# Patient Record
Sex: Female | Born: 1935 | Race: White | Hispanic: No | Marital: Married
Health system: Southern US, Community
[De-identification: ages and names within clinical notes are randomized; demographics above are authoritative.]

---

## 2010-04-23 ENCOUNTER — Ambulatory Visit: Payer: Self-pay | Admitting: Otolaryngology

## 2010-06-10 ENCOUNTER — Ambulatory Visit: Payer: Self-pay | Admitting: Otolaryngology

## 2012-06-24 ENCOUNTER — Emergency Department: Payer: Self-pay | Admitting: Emergency Medicine

## 2012-06-24 LAB — COMPREHENSIVE METABOLIC PANEL
Albumin: 3.8 g/dL (ref 3.4–5.0)
Alkaline Phosphatase: 86 U/L (ref 50–136)
Bilirubin,Total: 0.3 mg/dL (ref 0.2–1.0)
Calcium, Total: 9.2 mg/dL (ref 8.5–10.1)
Co2: 27 mmol/L (ref 21–32)
EGFR (Non-African Amer.): 47 — ABNORMAL LOW
Glucose: 109 mg/dL — ABNORMAL HIGH (ref 65–99)
SGOT(AST): 30 U/L (ref 15–37)
SGPT (ALT): 23 U/L
Total Protein: 7.6 g/dL (ref 6.4–8.2)

## 2012-06-24 LAB — URINALYSIS, COMPLETE
Bilirubin,UR: NEGATIVE
Glucose,UR: NEGATIVE mg/dL (ref 0–75)
Ketone: NEGATIVE
Leukocyte Esterase: NEGATIVE
Nitrite: NEGATIVE
Ph: 6 (ref 4.5–8.0)
RBC,UR: <1 /HPF (ref 0–5)
Squamous Epithelial: <1
WBC UR: 4 /HPF (ref 0–5)

## 2012-06-24 LAB — CBC
MCH: 32 pg (ref 26.0–34.0)
MCHC: 34.3 g/dL (ref 32.0–36.0)
MCV: 94 fL (ref 80–100)
Platelet: 189 10*3/uL (ref 150–440)
RDW: 12.7 % (ref 11.5–14.5)

## 2012-06-25 LAB — TROPONIN I: Troponin-I: <0.02 ng/mL

## 2012-07-05 ENCOUNTER — Ambulatory Visit: Payer: Self-pay | Admitting: Orthopedic Surgery

## 2013-06-24 IMAGING — CR LEFT WRIST - COMPLETE 3+ VIEW
1 series · 4 of 4 positions shown · non-contrast
Comparison: none

REASON FOR EXAM: trauma
COMMENTS:

PROCEDURE:     DXR - DXR WRIST LT COMP WITH OBLIQUES  - June 24, 2012  [DATE]
RESULT:     Comparison: None

[Series 1: x wrist pa left · 0.14mm/px · 4 of 4 slices shown]
[im 1/4]
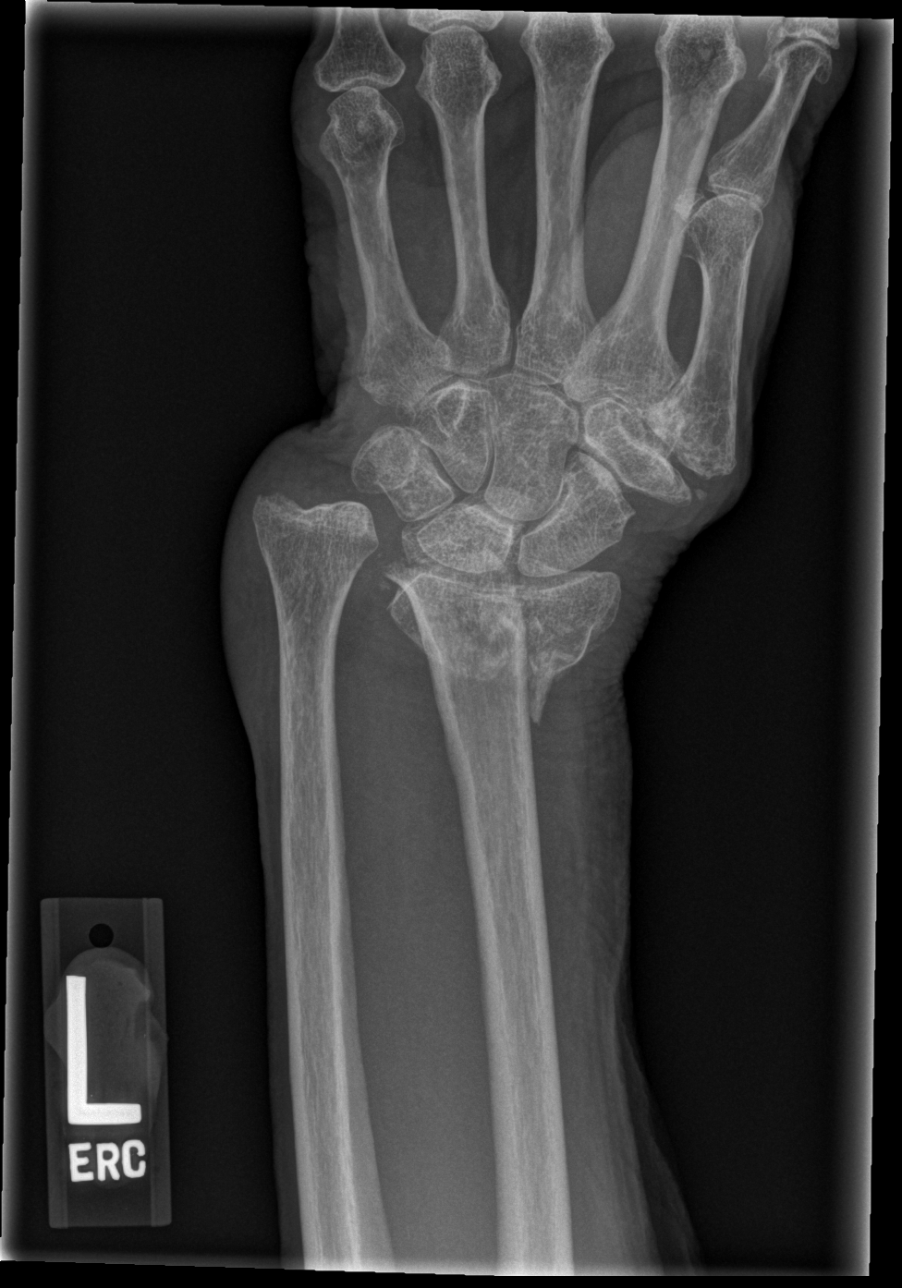
[im 2/4]
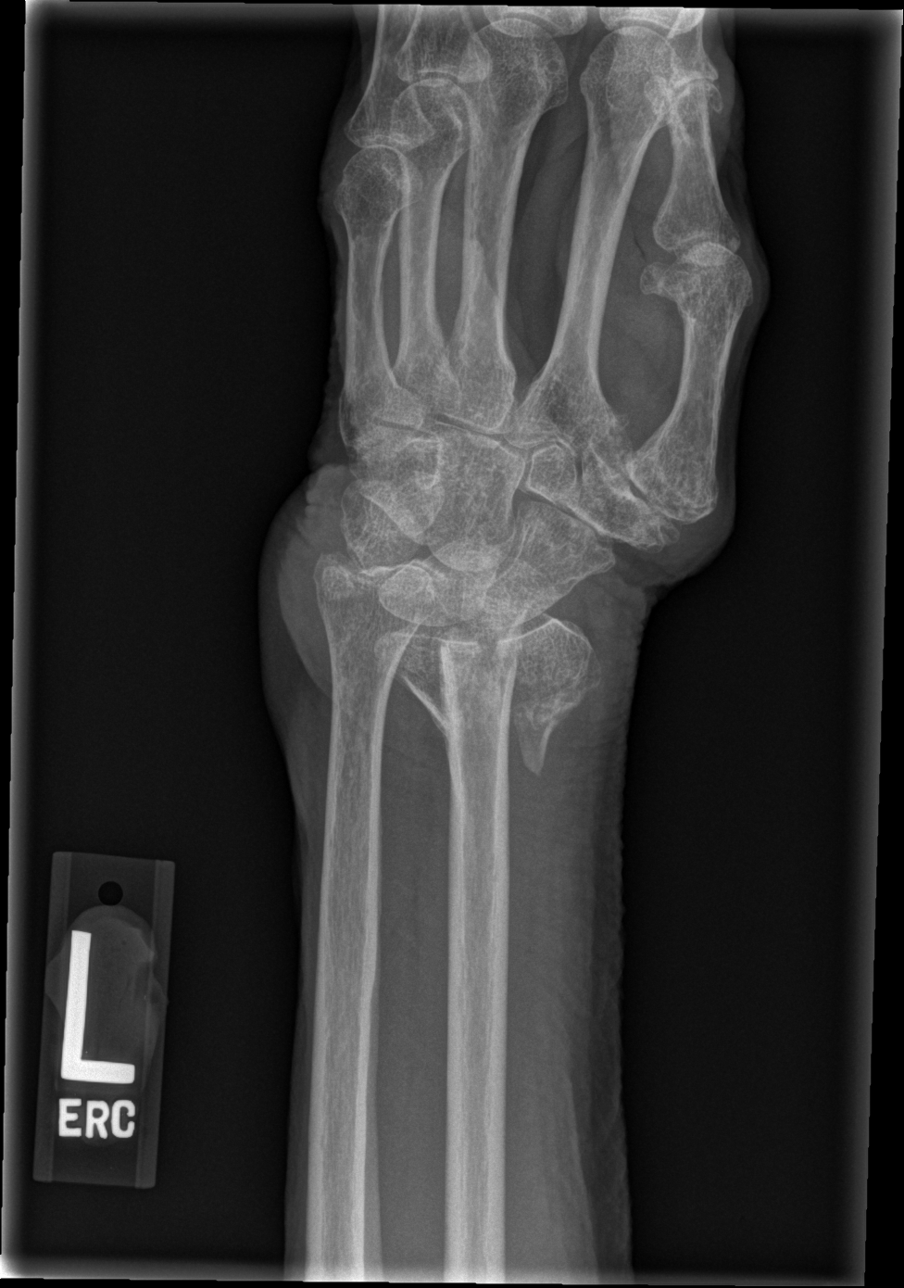
[im 3/4]
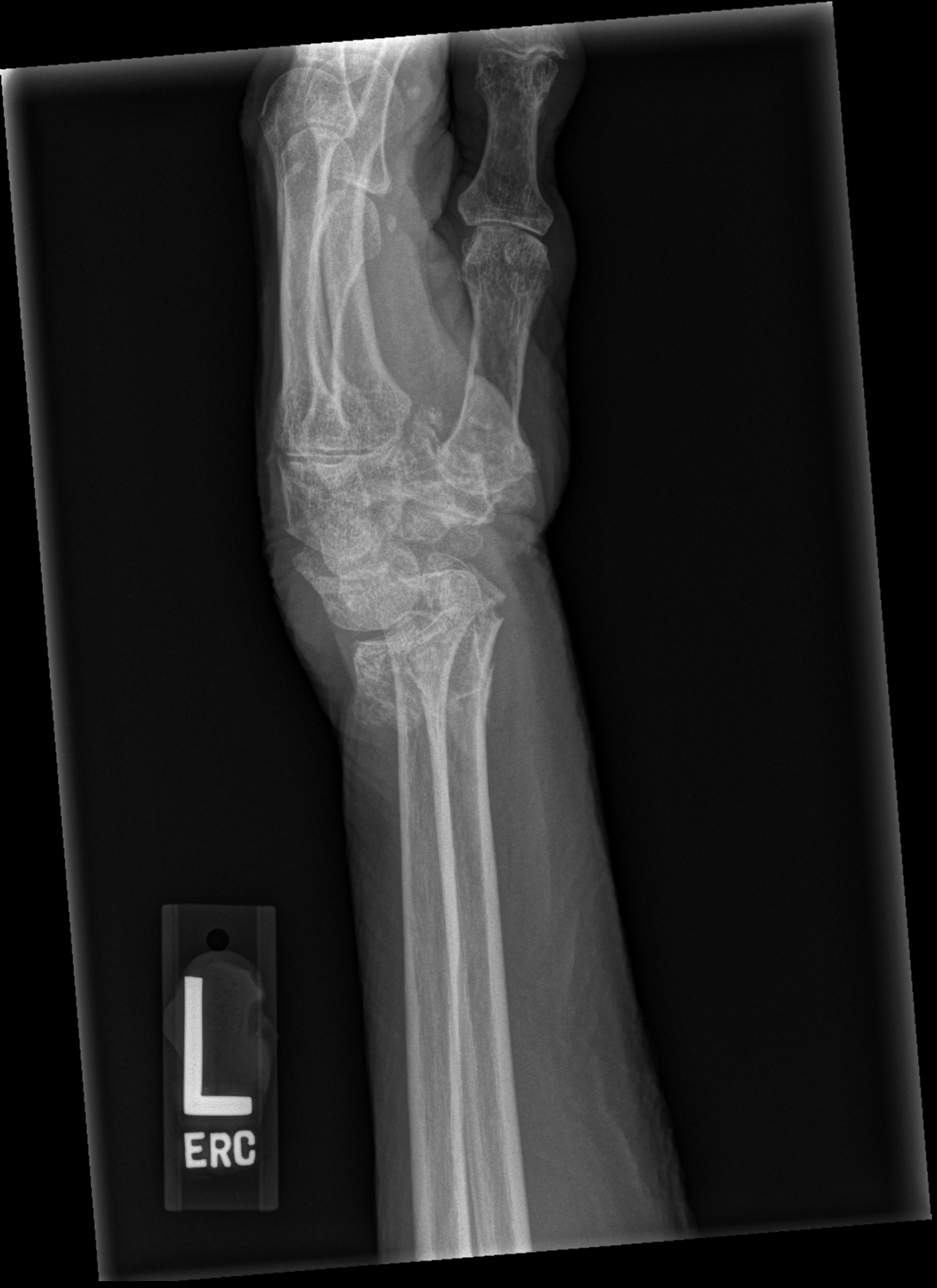
[im 4/4]
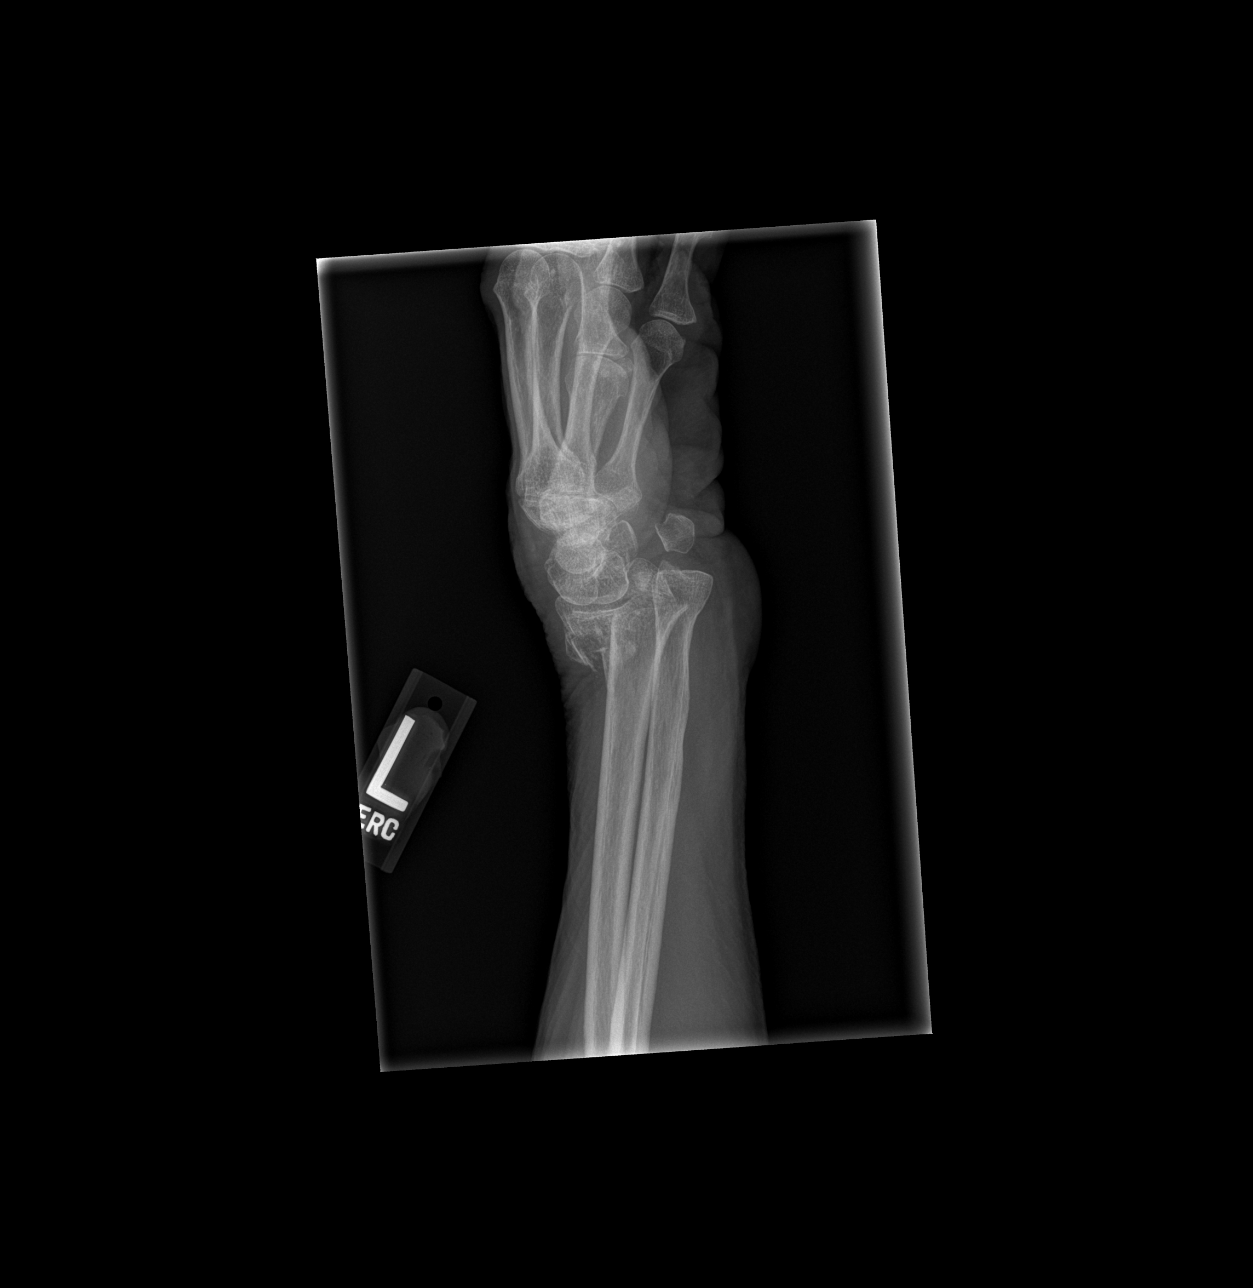

[4 of 4 positions shown; findings below may reference images not displayed]

FINDINGS: Four views of the left wrist demonstrates a comminuted an impacted distal
radial metaphyseal fracture with dorsal displacement. There is a 8 mm of
overriding between the fracture fragments.

 There is disruption of the distal radial ulnar joint. There is likely a
displaced ulnar styloid fracture. There are degenerative changes of the
first CMC joint.
IMPRESSION: Please see above.

[REDACTED]

## 2013-06-24 IMAGING — CR LEFT WRIST - COMPLETE 3+ VIEW
1 series · 4 of 4 positions shown · non-contrast
Comparison: none

REASON FOR EXAM: post reduction
COMMENTS:

PROCEDURE:     DXR - DXR WRIST LT COMP WITH OBLIQUES  - June 24, 2012 [DATE]
RESULT:     Comparison: June 24, 2012

[Series 6: x wrist pa left · 0.14mm/px · 4 of 4 slices shown]
[im 1/4]
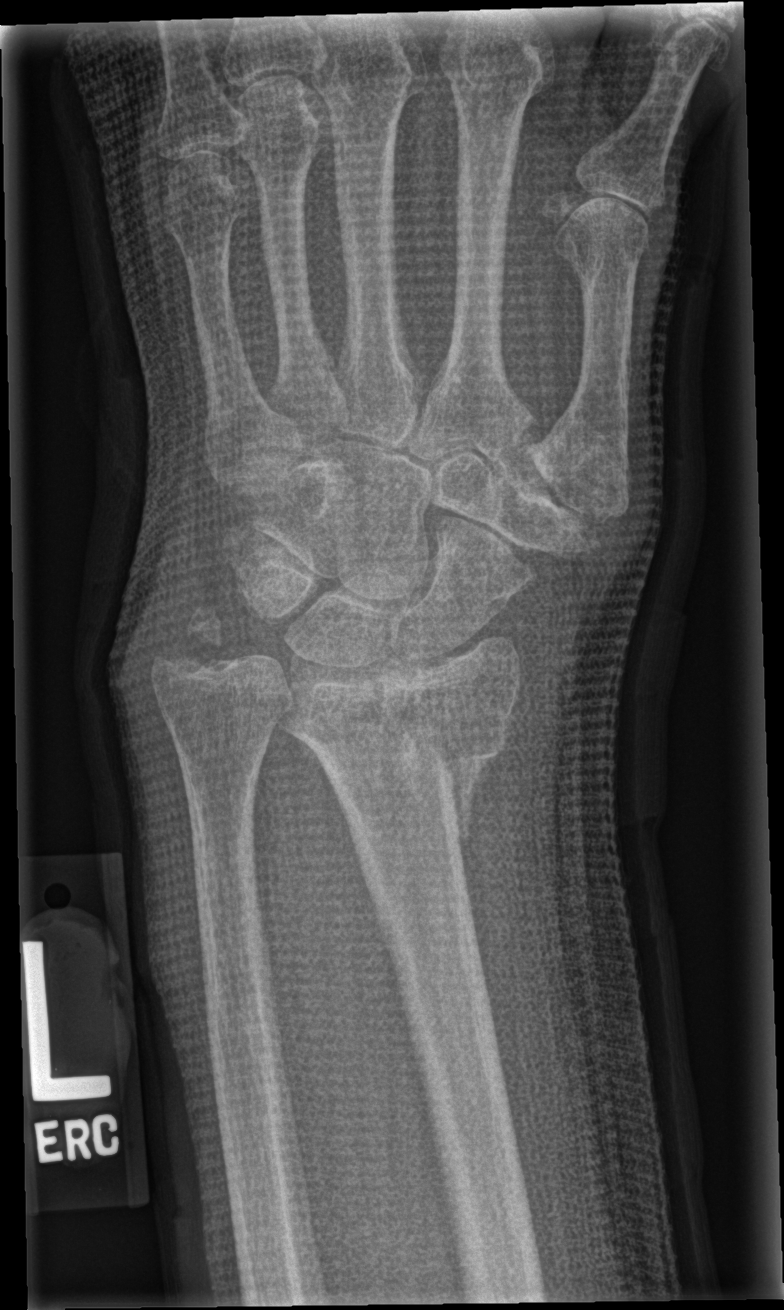
[im 2/4]
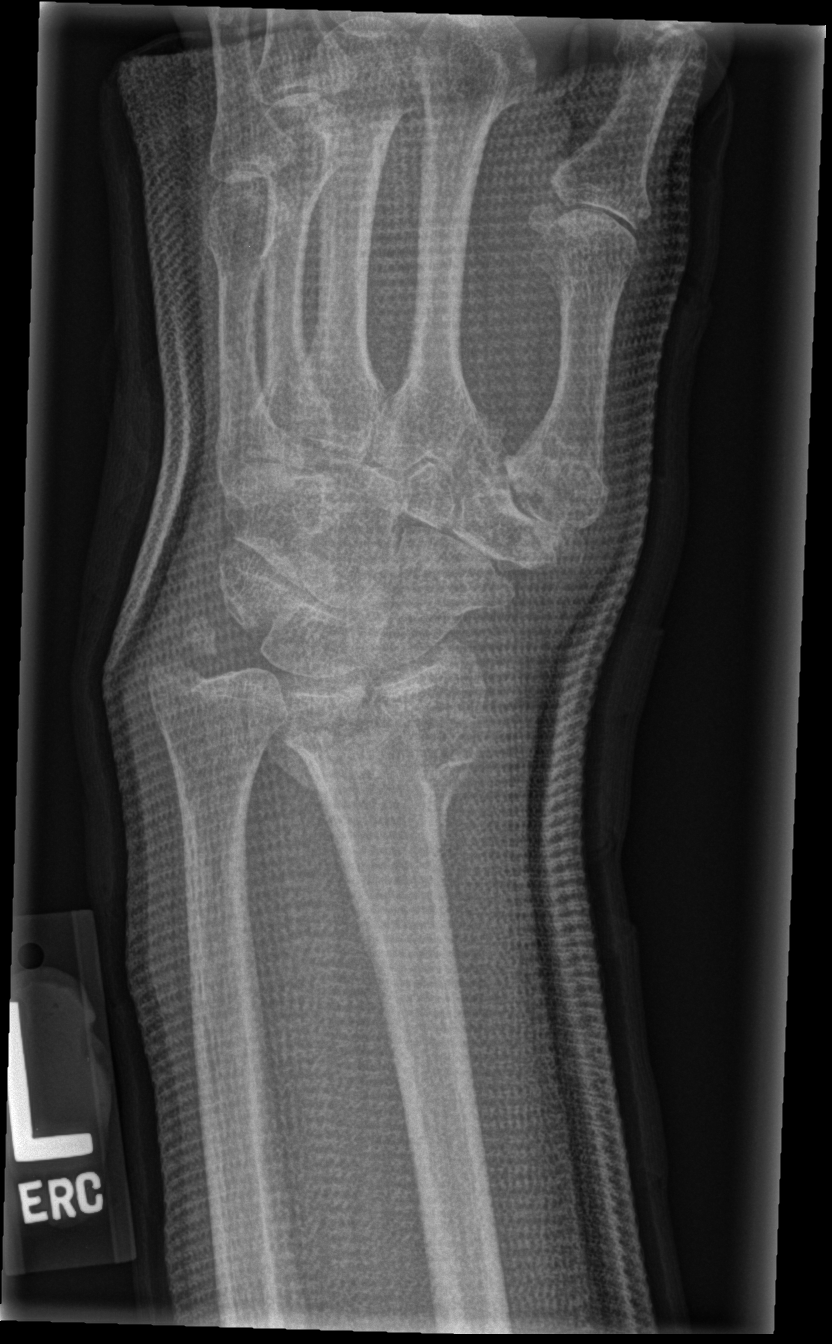
[im 3/4]
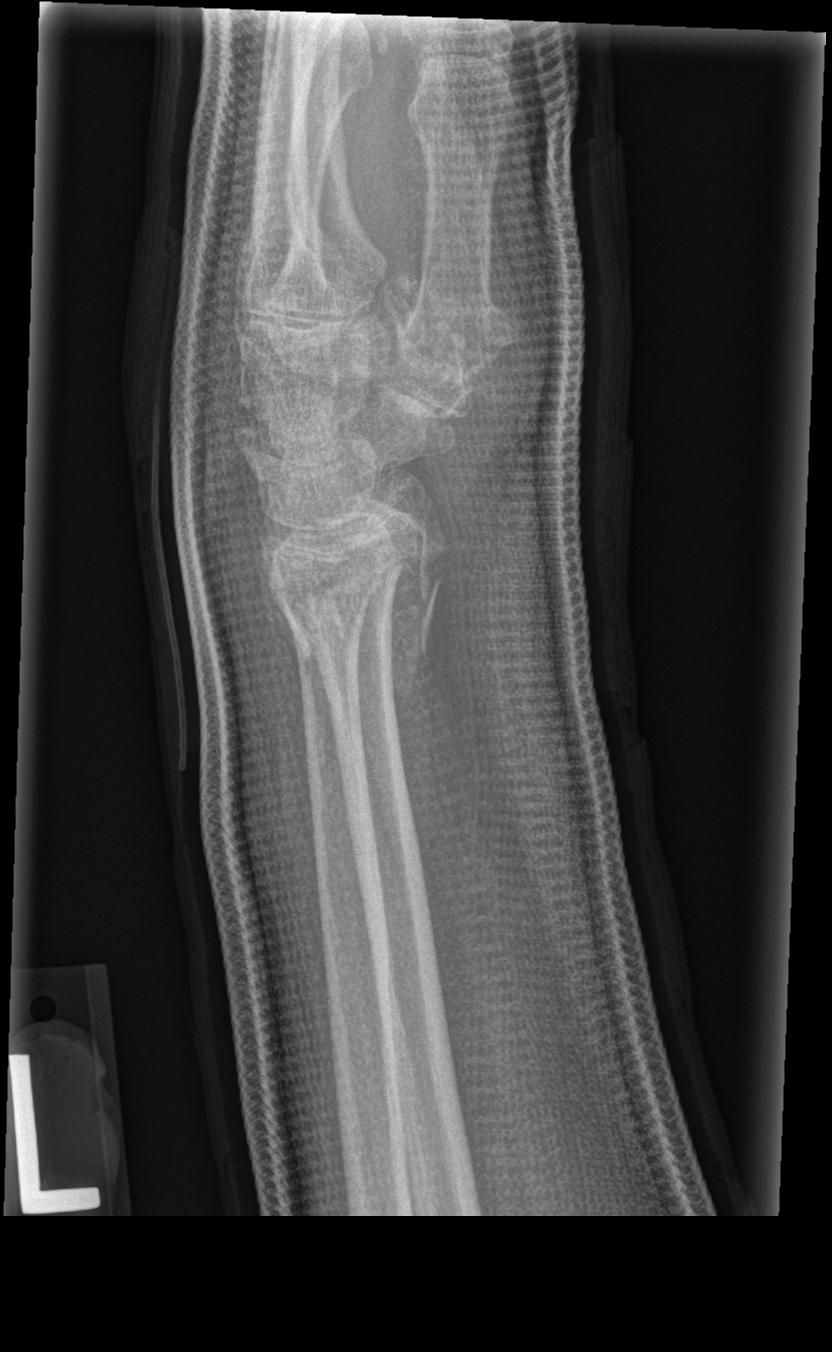
[im 4/4]
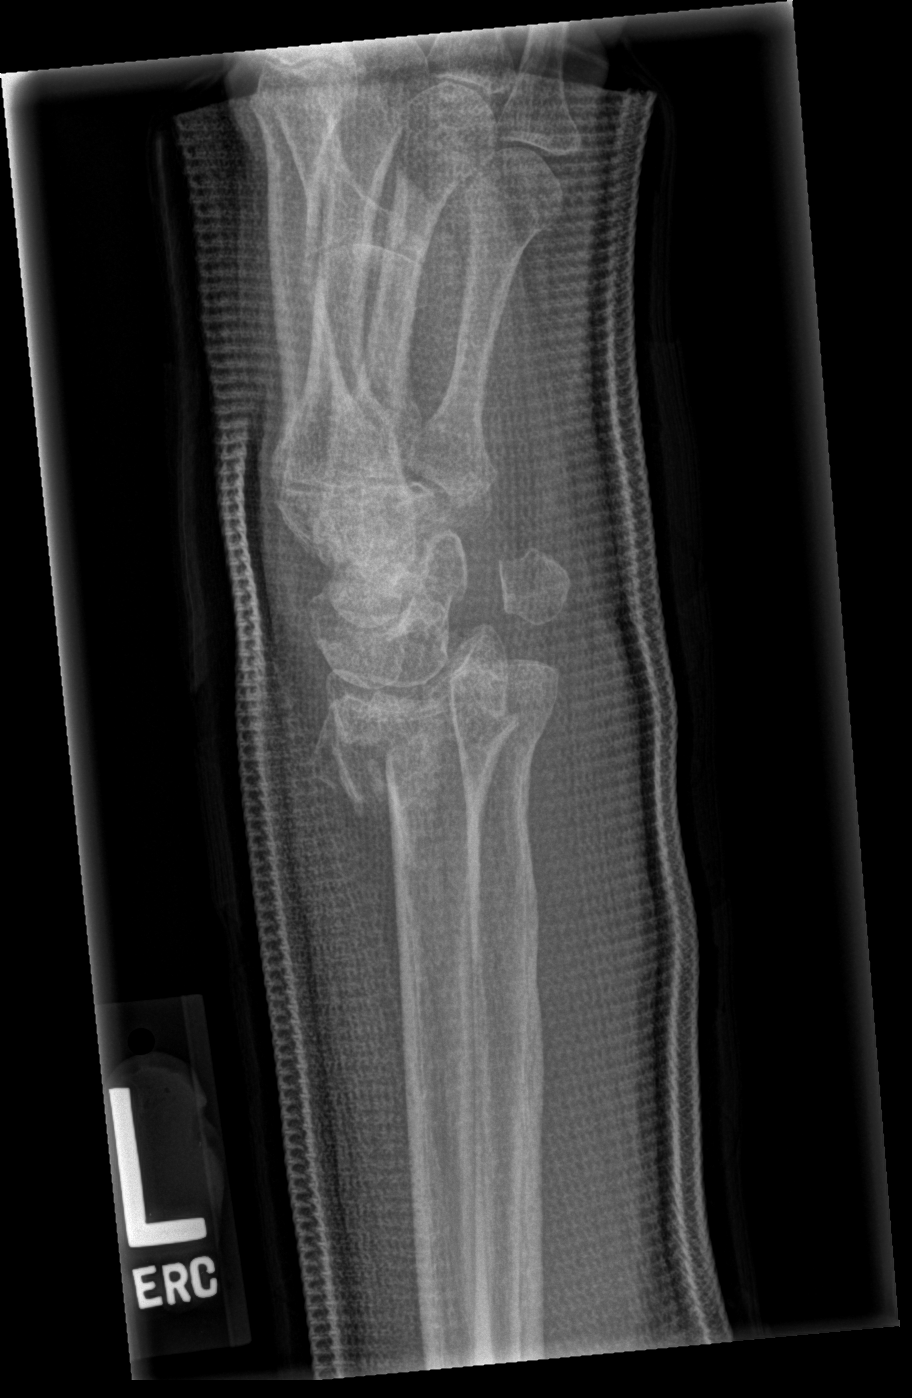

[4 of 4 positions shown; findings below may reference images not displayed]

FINDINGS: Four views of the left wrist demonstrates a comminuted an impacted distal
radial metaphyseal fracture with improved alignment compared with the prior
exam.

There is disruption of the distal radial ulnar joint. There is a displaced
ulnar styloid fracture. There are degenerative changes of the first CMC
joint.
IMPRESSION: Please see above.

[REDACTED]

## 2013-07-05 IMAGING — CT CT WRIST*L* W/O CM
2 series · 10 of 14 positions shown, 12 images · non-contrast
Comparison: none

REASON FOR EXAM: LT wrist fracture
COMMENTS:

PROCEDURE:     CT  - CT WRIST LEFT WITHOUT CONTRAST  - July 05, 2012  [DATE]
RESULT:
TECHNIQUE: Multiplanar imaging of the left wrist was obtained utilizing
helical 2 mm acquisition and bone reconstruction algorithm.

[Series 4: bone · axial · 0.33mm/px · z∈[-488,-432]mm · 4 of 48 slices shown]
[im 10/48  bone]
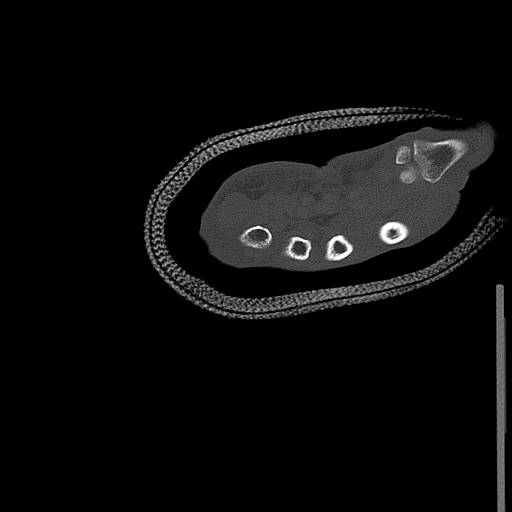
[im 19/48  bone]
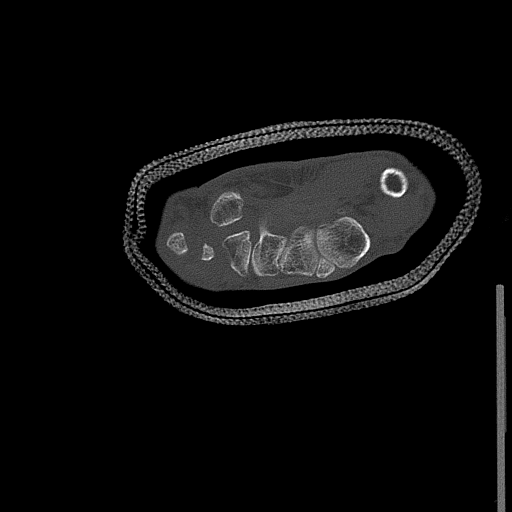
[im 29/48  bone]
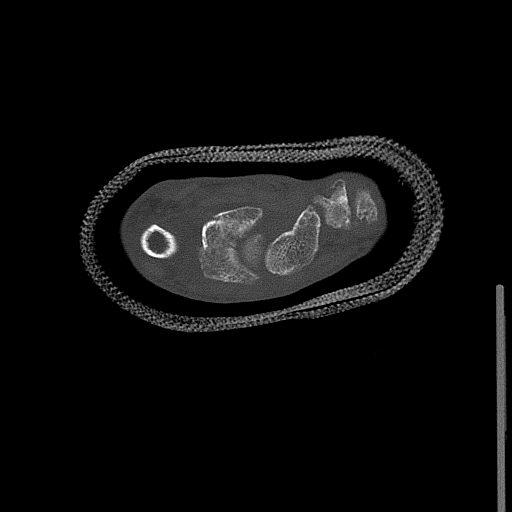
[im 38/48  bone]
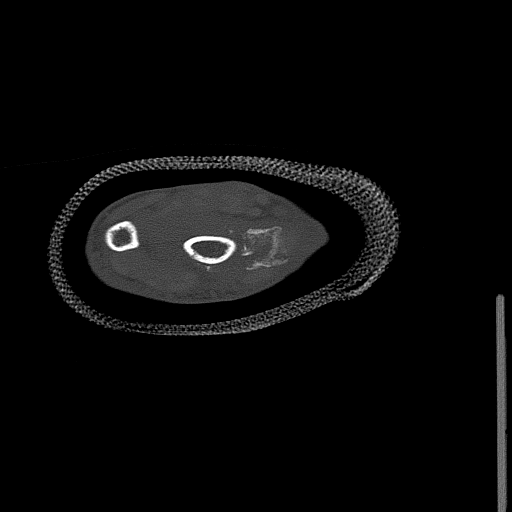

[Series 9: axial true · axial · 0.24mm/px · z∈[-511,-445]mm · 6 of 56 slices shown, 8 images]
[im 8/56  soft-tissue]
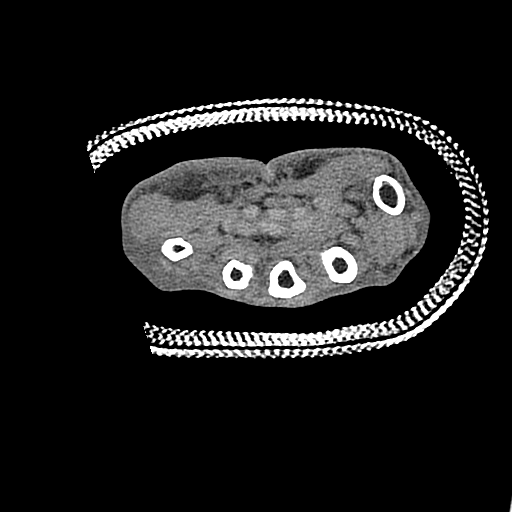
[im 8/56  bone]
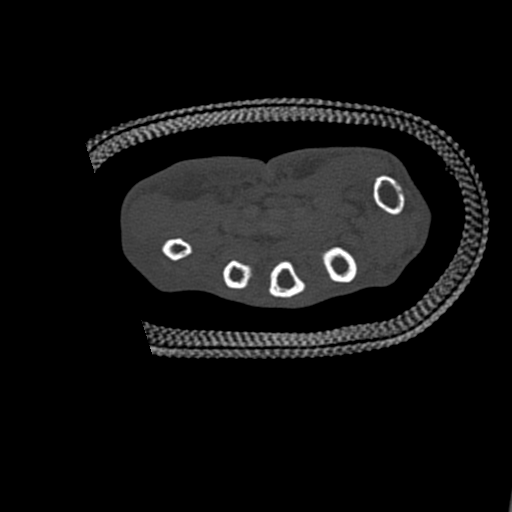
[im 16/56  bone]
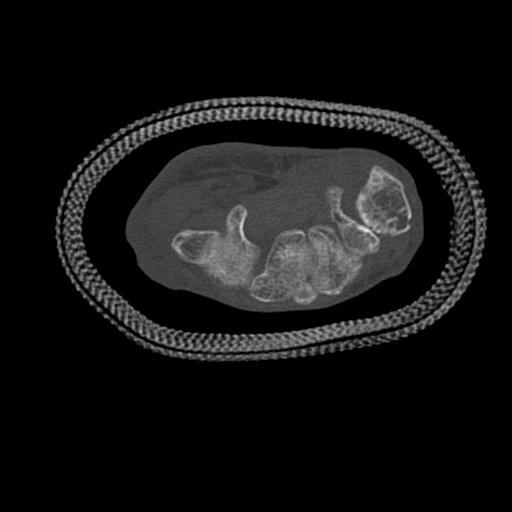
[im 24/56  bone]
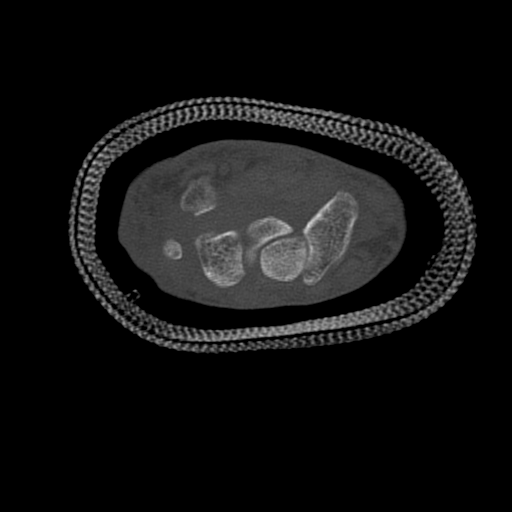
[im 32/56  bone]
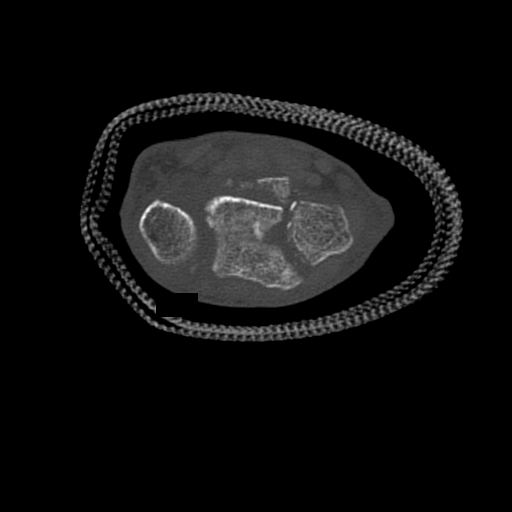
[im 40/56  soft-tissue]
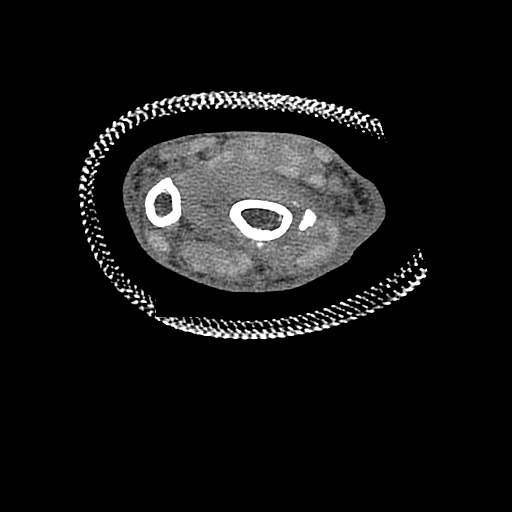
[im 40/56  bone]
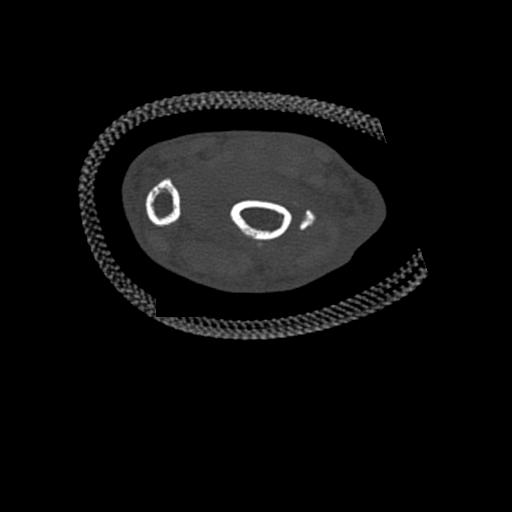
[im 48/56  bone]
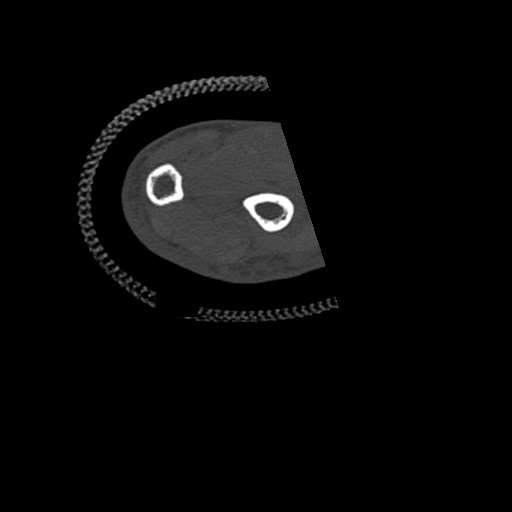

[10 of 14 positions shown; findings below may reference images not displayed]

FINDINGS: A comminuted, intra-articular impacted fracture is identified
involving the distal radius. The distal fragment of the fracture
demonstrates multiple comminuted fragments. There is intra-articular
extension into the radiocarpal joint as well as the distal radioulnar joint.
There does not appear to be significant angulation of the distal radius
fracture. A distracted radially angulated ulnar styloid fracture is
appreciated. Areas of subchondral cyst formation are identified throughout
the carpals. There is joint space narrowing and subchondral sclerosis
involving the first and second carpometacarpal articulation.
IMPRESSION: 1.  Comminuted, impacted intra-articular distal radius fracture as described
above.
2.  Osteoarthritic changes which appear to be moderate to severe within the
wrist.
3.  Ulnar styloid fracture.

## 2014-07-20 DEATH — deceased

## 2015-04-13 NOTE — Consult Note (Signed)
Brief Consult Note: Diagnosis: Left comminuted distal radius fracture.   Patient was seen by consultant.   Recommend to proceed with surgery or procedure.   Discussed with Attending MD.   Comments: Discussed with Dr. Daryel NovemberJonathan Williams in the ED.  Patient is an 81 y/o RHD female who fell off a step at her daughters house today.  She landed on the left outstretched hand and sustained a bruise to the left periorbital area.  She denies loss of consciousness.  She denies left wrist pain in the ED.  Patient's daughter was at the bedside.  On exam, patient has radial deviation to the wrist.  There is a hematoma over the ulna laterally.  There is a a small amount of blood from a pin sized hole in the same area.  The remaining skin is intact.  Patient can fully flex and extend her fingers and thumb and her digits are all well perfused.  She has a palpable radial pulse and she has intact sensation to light touch in all digits.  Radiographs demonstrate a comminuted, intra-articular fracture of the distal radius with significant radial shortening and dorsal angulation.  I explained the diagnosis to the patient and her daughter.  I recommended a closed reduction and splinting with a hematoma block.  The patient and her daughter agreed.  The patient's wound was cleaned with betadine and alcohol.  A band aid with bacitracin was placed over the wound.  A closed reduction was performed with placement of a sugar tong splint.  The patient was given a tetanus shot and will be discharged home on septra with instructions to call the office for an appointment on Monday.  The patient and her daughter understood and agreed with this plan.  Post-reduction films are ordered and pending at the time of this note.  Patient was given a sling for comfort.  Electronic Signatures: Juanell FairlyKrasinski, Bridgett Hattabaugh (MD)  (Signed 07-Jul-13 00:04)  Authored: Brief Consult Note   Last Updated: 07-Jul-13 00:04 by Juanell FairlyKrasinski, Nichele Slawson (MD)

## 2015-09-20 DEATH — deceased
